# Patient Record
Sex: Female | Born: 1970
Health system: Southern US, Community
[De-identification: ages and names within clinical notes are randomized; demographics above are authoritative.]

## PROBLEM LIST (undated history)

## (undated) HISTORY — PX: TUBAL LIGATION: SHX77

## (undated) HISTORY — PX: TONSILLECTOMY: SUR1361

---

## 1998-07-09 ENCOUNTER — Other Ambulatory Visit: Admission: RE | Admit: 1998-07-09 | Discharge: 1998-07-09 | Payer: Self-pay | Admitting: Obstetrics and Gynecology

## 1999-01-22 ENCOUNTER — Inpatient Hospital Stay (HOSPITAL_COMMUNITY): Admission: AD | Admit: 1999-01-22 | Discharge: 1999-01-25 | Payer: Self-pay | Admitting: Obstetrics and Gynecology

## 2000-01-01 ENCOUNTER — Other Ambulatory Visit: Admission: RE | Admit: 2000-01-01 | Discharge: 2000-01-01 | Payer: Self-pay | Admitting: Obstetrics and Gynecology

## 2000-01-20 ENCOUNTER — Ambulatory Visit (HOSPITAL_COMMUNITY): Admission: RE | Admit: 2000-01-20 | Discharge: 2000-01-20 | Payer: Self-pay | Admitting: Obstetrics and Gynecology

## 2002-08-06 ENCOUNTER — Emergency Department (HOSPITAL_COMMUNITY): Admission: EM | Admit: 2002-08-06 | Discharge: 2002-08-06 | Payer: Self-pay | Admitting: Emergency Medicine

## 2002-08-29 ENCOUNTER — Encounter: Admission: RE | Admit: 2002-08-29 | Discharge: 2002-08-29 | Payer: Self-pay | Admitting: Family Medicine

## 2002-08-29 ENCOUNTER — Encounter: Payer: Self-pay | Admitting: Family Medicine

## 2005-10-16 ENCOUNTER — Other Ambulatory Visit: Admission: RE | Admit: 2005-10-16 | Discharge: 2005-10-16 | Payer: Self-pay | Admitting: Family Medicine

## 2006-12-16 ENCOUNTER — Other Ambulatory Visit: Admission: RE | Admit: 2006-12-16 | Discharge: 2006-12-16 | Payer: Self-pay | Admitting: Family Medicine

## 2007-03-18 ENCOUNTER — Encounter: Admission: RE | Admit: 2007-03-18 | Discharge: 2007-03-18 | Payer: Self-pay | Admitting: Otolaryngology

## 2010-10-24 ENCOUNTER — Other Ambulatory Visit: Payer: Self-pay | Admitting: Family Medicine

## 2010-10-24 ENCOUNTER — Other Ambulatory Visit (HOSPITAL_COMMUNITY)
Admission: RE | Admit: 2010-10-24 | Discharge: 2010-10-24 | Disposition: A | Discharge status: Home / Self Care | Payer: PRIVATE HEALTH INSURANCE | Source: Ambulatory Visit | Attending: Family Medicine | Admitting: Family Medicine

## 2010-10-24 DIAGNOSIS — Z124 Encounter for screening for malignant neoplasm of cervix: Secondary | ICD-10-CM | POA: Insufficient documentation

## 2010-10-24 DIAGNOSIS — Z1231 Encounter for screening mammogram for malignant neoplasm of breast: Secondary | ICD-10-CM

## 2010-11-06 ENCOUNTER — Ambulatory Visit
Admission: RE | Admit: 2010-11-06 | Discharge: 2010-11-06 | Disposition: A | Discharge status: Home / Self Care | Payer: PRIVATE HEALTH INSURANCE | Source: Ambulatory Visit | Attending: Family Medicine | Admitting: Family Medicine

## 2010-11-06 DIAGNOSIS — Z1231 Encounter for screening mammogram for malignant neoplasm of breast: Secondary | ICD-10-CM

## 2010-12-26 NOTE — Op Note (Signed)
[facility]  Patient:    Anna Snow, Anna Snow                     MRN: [id number] Proc. Date: 01/20/00 Adm. Date:  [id number] Attending:  Dierdre Forth Pearline                           Operative Report  PREOPERATIVE DIAGNOSIS:       Desire for surgical sterilization.  POSTOPERATIVE DIAGNOSIS:      Desire for surgical sterilization.  OPERATION:                    Laparoscopic tubal cautery.  SURGEON:                      Vanessa P. Haygood, M.D.  ASSISTANT:  ANESTHESIA:                   General orotracheal.  ESTIMATED BLOOD LOSS:         Less than 10 cc.  COMPLICATIONS:                None.  FINDINGS:                     The uterus, tubes, and ovaries appeared within normal limits.  DESCRIPTION OF PROCEDURE:     The patient was taken to the operating room after  appropriate identification and placed on the operating table.  After the attainment of adequate general anesthesia, she was placed in the modified lithotomy position. The abdomen, perineum, and vagina were prepped with multiple layers of Betadine and a red Robinson catheter used to empty the bladder.  A single tooth tenaculum was placed on the anterior cervix and the abdomen draped as a sterile field.  The subumbilical area was infiltrated with 8 cc of 0.25% Marcaine and a subumbilical incision made.  The Veress cannula was placed through that incision into the peritoneal cavity and a pneumoperitoneum was created with 3 liters of CO2.  The  Veress cannula was removed and the laparoscopic trocar placed through that incision into the peritoneal cavity.  The laparoscope was placed through the trocar sleeve. The above noted findings were made.  The Kleppinger cautery was then used to identify the right fallopian tube, follow it to its fimbriated end, and then grasp it at the isthmic portion and cauterize that tube in two adjacent areas.  A similar procedure was carried out on  the opposite side.  Hemostasis was noted to be adequate. All instruments were then removed from the peritoneal cavity under direct visualization as the CO2 was allowed to escape.  The subumbilical incision was closed with subcuticular sutures of 3-0 Vicryl and Steri-Strips applied.  The single tooth tenaculum was removed and the bladder again emptied with a red Robinson catheter.  The patient was then awakened from general anesthesia and taken to the recovery room in satisfactory condition having tolerated the procedure well with sponge, needle, and instrument counts were correct. DD:  01/20/00 TD:  01/21/00 Job: [id number]

## 2011-11-18 ENCOUNTER — Other Ambulatory Visit: Payer: Self-pay | Admitting: Family Medicine

## 2011-11-18 DIAGNOSIS — Z1231 Encounter for screening mammogram for malignant neoplasm of breast: Secondary | ICD-10-CM

## 2011-12-09 ENCOUNTER — Ambulatory Visit
Admission: RE | Admit: 2011-12-09 | Discharge: 2011-12-09 | Disposition: A | Discharge status: Home / Self Care | Payer: PRIVATE HEALTH INSURANCE | Source: Ambulatory Visit | Attending: Family Medicine | Admitting: Family Medicine

## 2011-12-09 DIAGNOSIS — Z1231 Encounter for screening mammogram for malignant neoplasm of breast: Secondary | ICD-10-CM

## 2013-02-20 ENCOUNTER — Other Ambulatory Visit: Payer: Self-pay

## 2013-02-20 DIAGNOSIS — Z1231 Encounter for screening mammogram for malignant neoplasm of breast: Secondary | ICD-10-CM

## 2013-03-10 ENCOUNTER — Ambulatory Visit
Admission: RE | Admit: 2013-03-10 | Discharge: 2013-03-10 | Disposition: A | Discharge status: Home / Self Care | Payer: PRIVATE HEALTH INSURANCE | Source: Ambulatory Visit

## 2013-03-10 DIAGNOSIS — Z1231 Encounter for screening mammogram for malignant neoplasm of breast: Secondary | ICD-10-CM

## 2013-12-01 ENCOUNTER — Other Ambulatory Visit: Payer: Self-pay | Admitting: Obstetrics & Gynecology

## 2013-12-01 ENCOUNTER — Other Ambulatory Visit (HOSPITAL_COMMUNITY)
Admission: RE | Admit: 2013-12-01 | Discharge: 2013-12-01 | Disposition: A | Discharge status: Home / Self Care | Payer: BC Managed Care – PPO | Source: Ambulatory Visit | Attending: Obstetrics & Gynecology | Admitting: Obstetrics & Gynecology

## 2013-12-01 DIAGNOSIS — Z01419 Encounter for gynecological examination (general) (routine) without abnormal findings: Secondary | ICD-10-CM | POA: Insufficient documentation

## 2013-12-01 DIAGNOSIS — Z1151 Encounter for screening for human papillomavirus (HPV): Secondary | ICD-10-CM | POA: Insufficient documentation

## 2013-12-19 ENCOUNTER — Encounter (HOSPITAL_COMMUNITY): Payer: Self-pay | Admitting: Pharmacist

## 2014-01-08 NOTE — H&P (Signed)
Anna RhymesBridget J Hove is an 43 y.o. female. G2P2 who presents for hysteroscopy, D&C and Novasure ablation due to heavy menstrual bleeding.The patient reports that since having her tubes tied in 2001 she has had significant increase in her menses. For the last several years she has 2-3 days of heavy bleeding using at least 6-7 super pads daily. She is constantly concerned about having accidents and at night often times she has woken up with blood in the bed. Reports passage of grape-sized clots along with significant dysmenorrhea.    Pertinent Gynecological History: Menses: as above Contraception: tubal ligation DES exposure: denies Blood transfusions: yes in 1996 Sexually transmitted diseases: no past history Previous GYN Procedures: none  Last mammogram: normal Date: August 2014 Last pap: normal Date: April 2015 OB History: G2, P2   Current Medications  None   Surgical History  Tonsillectomy 2008  BTL 2001  C-Section 1996, 2000   Family History  Father: alive, A + W  Mother: alive, A + W  Maternal Grand Mother: alive, developed bullus pemphigoid at age 45  Brother 1: deceased 5941 yrs, liver disease, diagnosed with Cirrhosis  Sister 1: alive, A + W  denies any GYN family cancer hx.   Social History  General:  Tobacco use cigarettes: Never smoked.  no Smoking.  Alcohol: yes, Rare- Holidays, eg.  no Recreational drug use.  Diet: fruits and vegetables, some smoothies.  Exercise: Walking- less lately.  Occupation: employed, Theatre managervocational program director, [employer] [place]  Marital Status: married.  Children: Boys, 2- full-time custody.  Sons 1996 and 2000.   Gyn History  Sexual activity currently sexually active.  Periods : every 25 days.  LMP 12/27/13.  Birth control BTL.  Last pap smear date 12/01/2013.  Last mammogram date 03/2013.  Denies H/O Abnormal pap smear.    OB History  Pregnancy # 1 live birth, C-section delivery, boy.  Pregnancy # 2 live birth, C-section,  boy.    Allergies  N.K.D.A.    Review of Systems  Constitutional: Negative for fever and chills.  HENT: Negative for sore throat.   Eyes: Negative for blurred vision.  Respiratory: Negative for cough, shortness of breath and wheezing.   Cardiovascular: Negative for chest pain and palpitations.  Gastrointestinal: Negative for heartburn, nausea, vomiting, abdominal pain, diarrhea and constipation.  Genitourinary: Negative for dysuria, urgency and frequency.  Skin: Negative for itching and rash.  Neurological: Negative for dizziness and headaches.   Physical Exam  Constitutional: She is oriented to person, place, and time. She appears well-developed.  HENT:  Head: Normocephalic.  Neck: No thyromegaly present.  Cardiovascular: Normal rate and regular rhythm.   Respiratory: No respiratory distress. She has no wheezes.  GI: She exhibits no distension. There is no tenderness. There is no rebound and no guarding.  Musculoskeletal: She exhibits no edema and no tenderness.  Neurological: She is alert and oriented to person, place, and time.  Skin: Skin is warm and dry.  Psychiatric: She has a normal mood and affect.   5/1: US report: Retroverted uterus, several small fibroids 2.4x2.1x2.0cm, fundal 1.7x1.3x1.2cm, posterior submucosal 1.7x1.5x1.4. Uterus inhomogenous- probable small fibroids. Right ovary with simple cyst 2.5x2.4x2.2cm, avascular. Small ~1.5cm simple follicle adjacent to left ovary, avascular, possible para ovarian or paratubal cyst. Small amount of free fluid in cul-de-sac.   Assessment/Plan: [id number] who presents for hysteroscopy, D&C, Novasure ablation due to heavy menstrual bleeding -NPO -LR @ 125cc/hr -Ancef 2g to OR -SCDs to OR -Risk, benefit, indications and alternatives reviewed  with patient in office- including risk of failure of procedure, bleeding, infection and injury including uterine perforation.  Pt is aware and inform consent to be obtained on day of  surgery.  Myna Hidalgo, DO [phone] (pager) [phone] (office)     Alessandra Bevels Edon Hoadley 01/08/2014, 7:59 PM

## 2014-01-10 ENCOUNTER — Ambulatory Visit (HOSPITAL_COMMUNITY): Payer: BC Managed Care – PPO | Admitting: Certified Registered"

## 2014-01-10 ENCOUNTER — Encounter (HOSPITAL_COMMUNITY)
Admission: RE | Disposition: A | Discharge status: Home / Self Care | Payer: Self-pay | Source: Ambulatory Visit | Attending: Obstetrics & Gynecology

## 2014-01-10 ENCOUNTER — Ambulatory Visit (HOSPITAL_COMMUNITY)
Admission: RE | Admit: 2014-01-10 | Discharge: 2014-01-10 | Disposition: A | Discharge status: Home / Self Care | Payer: BC Managed Care – PPO | Source: Ambulatory Visit | Attending: Obstetrics & Gynecology | Admitting: Obstetrics & Gynecology

## 2014-01-10 ENCOUNTER — Encounter (HOSPITAL_COMMUNITY): Payer: Self-pay | Admitting: Anesthesiology

## 2014-01-10 ENCOUNTER — Encounter (HOSPITAL_COMMUNITY): Payer: BC Managed Care – PPO | Admitting: Certified Registered"

## 2014-01-10 DIAGNOSIS — N854 Malposition of uterus: Secondary | ICD-10-CM | POA: Insufficient documentation

## 2014-01-10 DIAGNOSIS — N920 Excessive and frequent menstruation with regular cycle: Secondary | ICD-10-CM | POA: Insufficient documentation

## 2014-01-10 HISTORY — PX: HYSTEROSCOPY WITH THERMACHOICE: SHX5396

## 2014-01-10 HISTORY — PX: DILITATION & CURRETTAGE/HYSTROSCOPY WITH NOVASURE ABLATION: SHX5568

## 2014-01-10 LAB — CBC
HCT: 38 % (ref 36.0–46.0)
Hemoglobin: 12.5 g/dL (ref 12.0–15.0)
MCH: 29.3 pg (ref 26.0–34.0)
MCHC: 32.9 g/dL (ref 30.0–36.0)
MCV: 89 fL (ref 78.0–100.0)
PLATELETS: 271 10*3/uL (ref 150–400)
RBC: 4.27 MIL/uL (ref 3.87–5.11)
RDW: 13.5 % (ref 11.5–15.5)
WBC: 7.2 10*3/uL (ref 4.0–10.5)

## 2014-01-10 LAB — PREGNANCY, URINE: PREG TEST UR: NEGATIVE

## 2014-01-10 SURGERY — DILATATION & CURETTAGE/HYSTEROSCOPY WITH NOVASURE ABLATION
Anesthesia: General | Site: Uterus

## 2014-01-10 MED ORDER — GLYCOPYRROLATE 0.2 MG/ML IJ SOLN
INTRAMUSCULAR | Status: DC | PRN
  Administered 2014-01-10: .15 mg via INTRAVENOUS

## 2014-01-10 MED ORDER — FENTANYL CITRATE 0.05 MG/ML IJ SOLN
INTRAMUSCULAR | Status: AC
  Filled 2014-01-10: qty 2

## 2014-01-10 MED ORDER — MIDAZOLAM HCL 2 MG/2ML IJ SOLN
INTRAMUSCULAR | Status: DC | PRN
  Administered 2014-01-10: 2 mg via INTRAVENOUS

## 2014-01-10 MED ORDER — PROPOFOL 10 MG/ML IV BOLUS
INTRAVENOUS | Status: DC | PRN
  Administered 2014-01-10: 150 mg via INTRAVENOUS

## 2014-01-10 MED ORDER — PHENYLEPHRINE 40 MCG/ML (10ML) SYRINGE FOR IV PUSH (FOR BLOOD PRESSURE SUPPORT)
PREFILLED_SYRINGE | INTRAVENOUS | Status: AC
  Filled 2014-01-10: qty 5

## 2014-01-10 MED ORDER — IBUPROFEN 600 MG PO TABS: 600.0000 mg | ORAL_TABLET | Freq: Four times a day (QID) | ORAL | Status: AC | PRN

## 2014-01-10 MED ORDER — MIDAZOLAM HCL 2 MG/2ML IJ SOLN
INTRAMUSCULAR | Status: AC
  Filled 2014-01-10: qty 2

## 2014-01-10 MED ORDER — ONDANSETRON HCL 4 MG/2ML IJ SOLN
INTRAMUSCULAR | Status: DC | PRN
  Administered 2014-01-10: 4 mg via INTRAVENOUS

## 2014-01-10 MED ORDER — LACTATED RINGERS IV SOLN
INTRAVENOUS | Status: DC
  Administered 2014-01-10: 11:00:00 via INTRAVENOUS

## 2014-01-10 MED ORDER — SILVER NITRATE-POT NITRATE 75-25 % EX MISC
CUTANEOUS | Status: AC
  Filled 2014-01-10: qty 1

## 2014-01-10 MED ORDER — DEXAMETHASONE SODIUM PHOSPHATE 10 MG/ML IJ SOLN
INTRAMUSCULAR | Status: DC | PRN
  Administered 2014-01-10: 10 mg via INTRAVENOUS

## 2014-01-10 MED ORDER — EPHEDRINE 5 MG/ML INJ
INTRAVENOUS | Status: AC
  Filled 2014-01-10: qty 20

## 2014-01-10 MED ORDER — LACTATED RINGERS IV SOLN
INTRAVENOUS | Status: DC
  Administered 2014-01-10 (×2): via INTRAVENOUS

## 2014-01-10 MED ORDER — PHENYLEPHRINE HCL 10 MG/ML IJ SOLN
INTRAMUSCULAR | Status: DC | PRN
  Administered 2014-01-10 (×7): 40 ug via INTRAVENOUS

## 2014-01-10 MED ORDER — IBUPROFEN 600 MG PO TABS: 600.0000 mg | ORAL_TABLET | Freq: Four times a day (QID) | ORAL | Status: DC | PRN

## 2014-01-10 MED ORDER — FENTANYL CITRATE 0.05 MG/ML IJ SOLN
INTRAMUSCULAR | Status: DC | PRN
  Administered 2014-01-10 (×2): 50 ug via INTRAVENOUS

## 2014-01-10 MED ORDER — LIDOCAINE HCL (CARDIAC) 20 MG/ML IV SOLN
INTRAVENOUS | Status: DC | PRN
  Administered 2014-01-10: 50 mg via INTRAVENOUS

## 2014-01-10 MED ORDER — CEFAZOLIN SODIUM-DEXTROSE 2-3 GM-% IV SOLR
2.0000 g | INTRAVENOUS | Status: AC
  Administered 2014-01-10: 2 g via INTRAVENOUS

## 2014-01-10 MED ORDER — KETOROLAC TROMETHAMINE 30 MG/ML IJ SOLN
INTRAMUSCULAR | Status: DC | PRN
  Administered 2014-01-10: 30 mg via INTRAVENOUS

## 2014-01-10 SURGICAL SUPPLY — 27 items
ABLATOR ENDOMETRIAL BIPOLAR (ABLATOR) ×1 IMPLANT
CANISTER SUCT 3000ML (MISCELLANEOUS) ×2 IMPLANT
CATH ROBINSON RED A/P 16FR (CATHETERS) ×2 IMPLANT
CATH THERMACHOICE III (CATHETERS) ×1 IMPLANT
CLOTH BEACON ORANGE TIMEOUT ST (SAFETY) ×2 IMPLANT
CONTAINER PREFILL 10% NBF 60ML (FORM) ×4 IMPLANT
D5W 250 ×1 IMPLANT
DILATOR CANAL MILEX (MISCELLANEOUS) IMPLANT
DRAPE HYSTEROSCOPY (DRAPE) ×2 IMPLANT
DRSG TELFA 3X8 NADH (GAUZE/BANDAGES/DRESSINGS) ×2 IMPLANT
GLOVE BIOGEL PI IND STRL 6.5 (GLOVE) ×2 IMPLANT
GLOVE BIOGEL PI INDICATOR 6.5 (GLOVE) ×2
GLOVE ECLIPSE 6.5 STRL STRAW (GLOVE) ×2 IMPLANT
GLYCINE 1.5% IRRIG UROMATIC (IV SOLUTION) ×1 IMPLANT
GOWN STRL REUS W/TWL LRG LVL3 (GOWN DISPOSABLE) ×4 IMPLANT
IV LACTATED RINGER IRRG 3000ML (IV SOLUTION) ×2
IV LR IRRIG 3000ML ARTHROMATIC (IV SOLUTION) IMPLANT
NDL SPNL 22GX3.5 QUINCKE BK (NEEDLE) ×1 IMPLANT
NEEDLE SPNL 22GX3.5 QUINCKE BK (NEEDLE) ×2 IMPLANT
PACK VAGINAL MINOR WOMEN LF (CUSTOM PROCEDURE TRAY) ×2 IMPLANT
PAD DRESSING TELFA 3X8 NADH (GAUZE/BANDAGES/DRESSINGS) ×1 IMPLANT
PAD OB MATERNITY 4.3X12.25 (PERSONAL CARE ITEMS) ×2 IMPLANT
SET TUBING HYSTEROSCOPY 2 NDL (TUBING) ×1 IMPLANT
SYR CONTROL 10ML LL (SYRINGE) ×2 IMPLANT
TOWEL OR 17X24 6PK STRL BLUE (TOWEL DISPOSABLE) ×4 IMPLANT
TUBE HYSTEROSCOPY W Y-CONNECT (TUBING) ×1 IMPLANT
WATER STERILE IRR 1000ML POUR (IV SOLUTION) ×2 IMPLANT

## 2014-01-10 NOTE — Interval H&P Note (Signed)
History and Physical Interval Note:  01/10/2014 10:41 AM  Anna Snow  has presented today for surgery, with the diagnosis of Heavy Menses  The various methods of treatment have been discussed with the patient and family. After consideration of risks, benefits and other options for treatment, the patient has consented to  Procedure(s): DILATATION & CURETTAGE/HYSTEROSCOPY WITH NOVASURE ABLATION (N/A) as a surgical intervention .  The patient's history has been reviewed, patient examined, no change in status, stable for surgery.  I have reviewed the patient's chart and labs.  Questions were answered to the patient's satisfaction.     Sharon Seller

## 2014-01-10 NOTE — Anesthesia Preprocedure Evaluation (Signed)
Anesthesia Evaluation  Patient identified by MRN, date of birth, ID band Patient awake    Reviewed: Allergy & Precautions, H&P , NPO status , Patient's Chart, lab work & pertinent test results  Airway Mallampati: I  TM Distance: >3 FB Neck ROM: full    Dental no notable dental hx. (+) Teeth Intact   Pulmonary neg pulmonary ROS,    Pulmonary exam normal       Cardiovascular negative cardio ROS      Neuro/Psych negative neurological ROS  negative psych ROS   GI/Hepatic negative GI ROS, Neg liver ROS,   Endo/Other  negative endocrine ROS  Renal/GU negative Renal ROS     Musculoskeletal   Abdominal Normal abdominal exam  (+)   Peds  Hematology negative hematology ROS (+)   Anesthesia Other Findings   Reproductive/Obstetrics negative OB ROS                            Anesthesia Physical Anesthesia Plan  ASA: I  Anesthesia Plan: General   Post-op Pain Management:    Induction: Intravenous  Airway Management Planned: LMA  Additional Equipment:   Intra-op Plan:   Post-operative Plan:   Informed Consent: I have reviewed the patients History and Physical, chart, labs and discussed the procedure including the risks, benefits and alternatives for the proposed anesthesia with the patient or authorized representative who has indicated his/her understanding and acceptance.     Plan Discussed with: CRNA and Surgeon  Anesthesia Plan Comments:        Anesthesia Quick Evaluation  

## 2014-01-10 NOTE — Op Note (Signed)
Operative Report  PreOp: Heavy menstrual bleeding PostOp: same Procedure:  Hysteroscopy, Dilation and Curettage, Failed Novasure, Thermachoice ablation Surgeon: Dr. Myna Hidalgo Anesthesia: General Complications:none EBL: Minimal UOP: 100cc IVF: 1100cc   Findings:  Retroverted 8wk sized uterus with proliferative endometrium, both ostia visualized  Specimens: 1) Endocervical curettings 2) endometrial curettings  Indications: 42y G2P2 who reports heavy menstrual bleeding since having her tubes tied in 2001.  For the last several years she has 2-3 days of heavy bleeding using at least 6-7 super pads daily. She is constantly concerned about having accidents and at night often times she has woken up with blood in the bed. Reports passage of grape-sized clots along with significant dysmenorrhea.    Procedure: The patient was taken to the operating room where she underwent general anesthesia without difficulty. The patient was placed in a low lithotomy position using Allen stirrups. She was prepped and draped in the normal sterile fashion. The bladder was drained using a red rubber urethral catheter. A sterile speculum was inserted into the vagina. A single tooth tenaculum was placed on the anterior lip of the cervix. ECC was obtained.  The uterus was then sounded to 8cm, cervical length of 4cm. The endocervical canal was then serially dilated to allow for passage of the hysteroscope. The diagnostic hysteroscope was then inserted without difficulty and noted to have the findings as listed above. Visualization was achieved using glycine as a distending medium. The hysteroscope was removed and sharp curettage was performed. The tissue was sent to pathology.   Attention was then turned to the Novasure. The Novasure was set up according to manufacture instructions. The cavity length was set to 4. The Novasure was inserted, seating test performed and the cavity width was noted to be 4.3. Cavity assessment  was performed and failed despite multiple attempts and use of gauze to ensure a tight seal. The device could not be activated as cavity assessment was not passed.  Novasure was removed and the hysteroscope was reinserted to ensure there was no uterine perforation.  Ablation was performed using the Thermachoice ablation system according to package guidelines.  Upon completion, the Thermachoice was removed and the hysteroscope was reinserted. Global ablation was visualized and no uterine perforation was seen. All instrument were then removed. Hemostasis was observed at the cervical site.  The patient was repositioned to the supine position. The patient tolerated the procedure without any complications and taken to recovery in stable condition.   Myna Hidalgo, DO [phone] (pager) [phone] (office)

## 2014-01-10 NOTE — Discharge Instructions (Signed)
HOME INSTRUCTIONS  May take ibuprofen after 6:00pm as needed for cramps  Please note any unusual or excessive bleeding, pain, swelling. Mild dizziness or drowsiness are normal for about 24 hours after surgery.   Shower when comfortable  Restrictions: No driving for 24 hours or while taking pain medications.  Activity:  No heavy lifting (> 10 lbs), nothing in vagina (no tampons, douching, or intercourse) x 4 weeks; no tub baths for 4 weeks Vaginal spotting is expected but if your bleeding is heavy, period like,  please call the office   Incision: the bandaids will fall off when they are ready to; you may clean your incision with mild soap and water but do not rub or scrub the incision site.  You may experience slight bloody drainage from your incision periodically.  This is normal.  If you experience a large amount of drainage or the incision opens, please call your physician who will likely direct you to the emergency department.  Diet:  You may eat whatever you want.  Do not eat large meals.  Eat small frequent meals throughout the day.  Continue to drink a good amount of water at least 6-8 glasses of water per day, hydration is very important for the healing process.  Pain Management: Take Motrin and/or Norco as prescribed/needed for pain.  Always take prescription pain medication with food, it may cause constipation, increase fluids and fiber and you may want to take an over-the-counter stool softener like Colace as needed up to 2x a day.    Alcohol -- Avoid for 24 hours and while taking pain medications.  Nausea: Take sips of ginger ale or soda  Fever -- Call physician if temperature over 101 degrees  Follow up:  If you do not already have a follow up appointment scheduled, please call the office at [phone].  If you experience fever (a temperature greater than 100.4), pain unrelieved by pain medication, shortness of breath, swelling of a single leg, or any other symptoms which are  concerning to you please the office immediately.

## 2014-01-10 NOTE — Anesthesia Postprocedure Evaluation (Signed)
Anesthesia Post Note  Patient: Anna Snow  Procedure(s) Performed: Procedure(s) (LRB): DILATATION & CURETTAGE/HYSTEROSCOPY WITH failed NOVASURE ABLATION (N/A) HYSTEROSCOPY WITH THERMACHOICE (N/A)  Anesthesia type: General  Patient location: PACU  Post pain: Pain level controlled  Post assessment: Post-op Vital signs reviewed  Last Vitals:  Filed Vitals:   01/10/14 1230  BP: 111/61  Pulse: 74  Temp: 36.7 C  Resp: 12    Post vital signs: Reviewed  Level of consciousness: sedated  Complications: No apparent anesthesia complications

## 2014-01-10 NOTE — Transfer of Care (Signed)
Immediate Anesthesia Transfer of Care Note  Patient: Anna Snow  Procedure(s) Performed: Procedure(s): DILATATION & CURETTAGE/HYSTEROSCOPY WITH failed NOVASURE ABLATION (N/A) HYSTEROSCOPY WITH THERMACHOICE (N/A)  Patient Location: PACU  Anesthesia Type:General  Level of Consciousness: awake, alert  and oriented  Airway & Oxygen Therapy: Patient Spontanous Breathing  Post-op Assessment: Report given to PACU RN and Post -op Vital signs reviewed and stable  Post vital signs: Reviewed and stable  Complications: No apparent anesthesia complications

## 2014-01-11 ENCOUNTER — Encounter (HOSPITAL_COMMUNITY): Payer: Self-pay | Admitting: Obstetrics & Gynecology

## 2014-04-11 ENCOUNTER — Other Ambulatory Visit: Payer: Self-pay

## 2014-04-11 DIAGNOSIS — Z1231 Encounter for screening mammogram for malignant neoplasm of breast: Secondary | ICD-10-CM

## 2014-04-27 ENCOUNTER — Ambulatory Visit
Admission: RE | Admit: 2014-04-27 | Discharge: 2014-04-27 | Disposition: A | Discharge status: Home / Self Care | Payer: BC Managed Care – PPO | Source: Ambulatory Visit

## 2014-04-27 ENCOUNTER — Ambulatory Visit: Payer: PRIVATE HEALTH INSURANCE

## 2014-04-27 DIAGNOSIS — Z1231 Encounter for screening mammogram for malignant neoplasm of breast: Secondary | ICD-10-CM

## 2015-04-06 ENCOUNTER — Encounter (HOSPITAL_COMMUNITY): Payer: Self-pay | Admitting: Emergency Medicine

## 2015-04-06 ENCOUNTER — Emergency Department (INDEPENDENT_AMBULATORY_CARE_PROVIDER_SITE_OTHER)
Admission: EM | Admit: 2015-04-06 | Discharge: 2015-04-06 | Disposition: A | Discharge status: Home / Self Care | Payer: BLUE CROSS/BLUE SHIELD | Source: Home / Self Care | Attending: Family Medicine | Admitting: Family Medicine

## 2015-04-06 DIAGNOSIS — Z23 Encounter for immunization: Secondary | ICD-10-CM | POA: Diagnosis not present

## 2015-04-06 DIAGNOSIS — S0181XA Laceration without foreign body of other part of head, initial encounter: Secondary | ICD-10-CM | POA: Diagnosis not present

## 2015-04-06 MED ORDER — TETANUS-DIPHTH-ACELL PERTUSSIS 5-2.5-18.5 LF-MCG/0.5 IM SUSP
INTRAMUSCULAR | Status: AC
  Filled 2015-04-06: qty 0.5

## 2015-04-06 MED ORDER — TETANUS-DIPHTH-ACELL PERTUSSIS 5-2.5-18.5 LF-MCG/0.5 IM SUSP
0.5000 mL | Freq: Once | INTRAMUSCULAR | Status: AC
  Administered 2015-04-06: 0.5 mL via INTRAMUSCULAR

## 2015-04-06 NOTE — ED Provider Notes (Signed)
CSN: [id number]     Arrival date & time 04/06/15  1816 History   First MD Initiated Contact with Patient 04/06/15 1908     Chief Complaint  Patient presents with  . Laceration   (Consider location/radiation/quality/duration/timing/severity/associated sxs/prior Treatment) Patient is a 44 y.o. female presenting with scalp laceration. The history is provided by the patient.  Head Laceration This is a new problem. The current episode started 1 to 2 hours ago (struck forehead on edge of car door while running to get out of rain). The problem has not changed since onset.Pertinent negatives include no chest pain and no headaches. Nothing aggravates the symptoms. Nothing relieves the symptoms.    History reviewed. No pertinent past medical history. Past Surgical History  Procedure Laterality Date  . Tubal ligation    . Cesarean section  1996, 2000  . Tonsillectomy    . Dilitation & currettage/hystroscopy with novasure ablation N/A 01/10/2014    Procedure: DILATATION & CURETTAGE/HYSTEROSCOPY WITH failed NOVASURE ABLATION;  Surgeon: Sharon Seller, DO;  Location: [facility];  Service: Gynecology;  Laterality: N/A;  . Hysteroscopy with thermachoice N/A 01/10/2014    Procedure: HYSTEROSCOPY WITH THERMACHOICE;  Surgeon: Sharon Seller, DO;  Location: [facility];  Service: Gynecology;  Laterality: N/A;   No family history on file. Social History  Substance Use Topics  . Smoking status: Never Smoker   . Smokeless tobacco: None  . Alcohol Use: No   OB History    No data available     Review of Systems  Constitutional: Negative.   HENT: Negative.   Cardiovascular: Negative for chest pain.  Neurological: Negative.  Negative for headaches.    Allergies  Review of patient's allergies indicates no known allergies.  Home Medications   Prior to Admission medications   Medication Sig Start Date End Date Taking? Authorizing Provider  ibuprofen (ADVIL,MOTRIN) 600 MG tablet Take 1 tablet (600 mg total)  by mouth every 6 (six) hours as needed. 01/10/14   Myna Hidalgo, DO   Meds Ordered and Administered this Visit   Medications  Tdap (BOOSTRIX) injection 0.5 mL (not administered)    BP 154/99 mmHg  Pulse 70  Temp(Src) 97.8 F (36.6 C) (Oral)  Resp 18  SpO2 99% No data found.   Physical Exam  Constitutional: She is oriented to person, place, and time. She appears well-developed and well-nourished. No distress.  HENT:  Head: Normocephalic.  2cm forehead lac.  Eyes: Conjunctivae and EOM are normal. Pupils are equal, round, and reactive to light.  Neck: Normal range of motion. Neck supple.  Neurological: She is alert and oriented to person, place, and time. No cranial nerve deficit.  Skin: Skin is warm and dry.  Nursing note and vitals reviewed.   ED Course  LACERATION REPAIR Date/Time: 04/06/2015 7:27 PM Performed by: Linna Hoff Authorized by: Bradd Canary D Consent: Verbal consent obtained. Consent given by: patient Body area: head/neck Location details: forehead Laceration length: 2.5 cm Foreign bodies: no foreign bodies Tendon involvement: none Nerve involvement: none Vascular damage: no Patient sedated: no Preparation: Patient was prepped and draped in the usual sterile fashion. Irrigation solution: tap water Amount of cleaning: standard Debridement: none Degree of undermining: none Skin closure: glue Technique: simple Approximation: close Approximation difficulty: simple Patient tolerance: Patient tolerated the procedure well with no immediate complications   (including critical care time)  Labs Review Labs Reviewed - No data to display  Imaging Review No results found.   Visual Acuity Review  Right Eye Distance:   Left Eye Distance:   Bilateral Distance:    Right Eye Near:   Left Eye Near:    Bilateral Near:         MDM   1. Laceration of forehead without complication, initial encounter        Linna Hoff, MD 04/06/15 [phone]

## 2015-04-06 NOTE — ED Notes (Signed)
Running to car in the rain, hit head on car door, laceration to left forehead

## 2015-05-01 ENCOUNTER — Emergency Department (INDEPENDENT_AMBULATORY_CARE_PROVIDER_SITE_OTHER)
Admission: EM | Admit: 2015-05-01 | Discharge: 2015-05-01 | Disposition: A | Discharge status: Home / Self Care | Payer: BLUE CROSS/BLUE SHIELD | Source: Home / Self Care | Attending: Emergency Medicine | Admitting: Emergency Medicine

## 2015-05-01 ENCOUNTER — Encounter (HOSPITAL_COMMUNITY): Payer: Self-pay | Admitting: Emergency Medicine

## 2015-05-01 DIAGNOSIS — Z5189 Encounter for other specified aftercare: Secondary | ICD-10-CM | POA: Diagnosis not present

## 2015-05-01 NOTE — Discharge Instructions (Signed)
There is some scar tissue in a little bit of swelling. Apply ice to help with the swelling. You can do some massage to help break up the scar tissue. Use sunscreen on the scar to help it fade. The cut has healed well.

## 2015-05-01 NOTE — ED Notes (Signed)
Pt is here to have a wound on her left forehead rechecked.  The wound has healed, but she has a small knot that has developed just above the scar.  It is tender to touch.

## 2015-05-01 NOTE — ED Provider Notes (Signed)
CSN: [id number]     Arrival date & time 05/01/15  1335 History   First MD Initiated Contact with Patient 05/01/15 1640     Chief Complaint  Patient presents with  . Wound Check   (Consider location/radiation/quality/duration/timing/severity/associated sxs/prior Treatment) HPI  She is a 44 year old woman here for a wound check. She was here about one month ago for a laceration to her left forehead. It was repaired with glue at that time. She states the glue fell off about a week and a half ago. She states it has healed well, but she has had a bump at the laceration and some swelling. She states it is a little sore. No redness or drainage.  History reviewed. No pertinent past medical history. Past Surgical History  Procedure Laterality Date  . Tubal ligation    . Cesarean section  1996, 2000  . Tonsillectomy    . Dilitation & currettage/hystroscopy with novasure ablation N/A 01/10/2014    Procedure: DILATATION & CURETTAGE/HYSTEROSCOPY WITH failed NOVASURE ABLATION;  Surgeon: Sharon Seller, DO;  Location: [facility];  Service: Gynecology;  Laterality: N/A;  . Hysteroscopy with thermachoice N/A 01/10/2014    Procedure: HYSTEROSCOPY WITH THERMACHOICE;  Surgeon: Sharon Seller, DO;  Location: [facility];  Service: Gynecology;  Laterality: N/A;   History reviewed. No pertinent family history. Social History  Substance Use Topics  . Smoking status: Never Smoker   . Smokeless tobacco: None  . Alcohol Use: No   OB History    No data available     Review of Systems As in history of present illness Allergies  Review of patient's allergies indicates no known allergies.  Home Medications   Prior to Admission medications   Medication Sig Start Date End Date Taking? Authorizing Francisca Langenderfer  ibuprofen (ADVIL,MOTRIN) 600 MG tablet Take 1 tablet (600 mg total) by mouth every 6 (six) hours as needed. 01/10/14   Myna Hidalgo, DO   Meds Ordered and Administered this Visit  Medications - No data to  display  BP 124/80 mmHg  Pulse 86  Temp(Src) 97.9 F (36.6 C) (Oral)  Resp 18  SpO2 99%  LMP 04/29/2015 (Exact Date) No data found.   Physical Exam  Constitutional: She is oriented to person, place, and time. She appears well-developed and well-nourished. No distress.  Cardiovascular: Normal rate.   Pulmonary/Chest: Effort normal.  Neurological: She is alert and oriented to person, place, and time.  Skin:  Well-healed 1 cm laceration to left forehead. There is some underlying scar tissue and a small amount of swelling.    ED Course  Procedures (including critical care time)  Labs Review Labs Reviewed - No data to display  Imaging Review No results found.   MDM   1. Visit for wound check    Laceration has healed well. Reassurance provided. Recommended massage and ice to help with the scar tissue and swelling.     Charm Rings, MD 05/01/15 1710

## 2015-12-02 ENCOUNTER — Other Ambulatory Visit: Payer: Self-pay

## 2015-12-02 DIAGNOSIS — Z1231 Encounter for screening mammogram for malignant neoplasm of breast: Secondary | ICD-10-CM

## 2015-12-19 ENCOUNTER — Ambulatory Visit
Admission: RE | Admit: 2015-12-19 | Discharge: 2015-12-19 | Disposition: A | Discharge status: Home / Self Care | Payer: BLUE CROSS/BLUE SHIELD | Source: Ambulatory Visit

## 2015-12-19 DIAGNOSIS — Z1231 Encounter for screening mammogram for malignant neoplasm of breast: Secondary | ICD-10-CM | POA: Diagnosis not present

## 2015-12-25 DIAGNOSIS — H5213 Myopia, bilateral: Secondary | ICD-10-CM | POA: Diagnosis not present

## 2016-01-13 DIAGNOSIS — Z01411 Encounter for gynecological examination (general) (routine) with abnormal findings: Secondary | ICD-10-CM | POA: Diagnosis not present

## 2016-01-13 DIAGNOSIS — N939 Abnormal uterine and vaginal bleeding, unspecified: Secondary | ICD-10-CM | POA: Diagnosis not present

## 2016-01-16 DIAGNOSIS — Z0000 Encounter for general adult medical examination without abnormal findings: Secondary | ICD-10-CM | POA: Diagnosis not present

## 2017-01-08 ENCOUNTER — Other Ambulatory Visit: Payer: Self-pay | Admitting: Obstetrics and Gynecology

## 2017-01-08 ENCOUNTER — Other Ambulatory Visit: Payer: Self-pay | Admitting: Obstetrics & Gynecology

## 2017-01-08 DIAGNOSIS — Z1231 Encounter for screening mammogram for malignant neoplasm of breast: Secondary | ICD-10-CM

## 2017-01-15 DIAGNOSIS — Z01419 Encounter for gynecological examination (general) (routine) without abnormal findings: Secondary | ICD-10-CM | POA: Diagnosis not present

## 2017-01-29 ENCOUNTER — Ambulatory Visit
Admission: RE | Admit: 2017-01-29 | Discharge: 2017-01-29 | Disposition: A | Discharge status: Home / Self Care | Payer: BLUE CROSS/BLUE SHIELD | Source: Ambulatory Visit | Attending: Obstetrics & Gynecology | Admitting: Obstetrics & Gynecology

## 2017-01-29 DIAGNOSIS — Z1231 Encounter for screening mammogram for malignant neoplasm of breast: Secondary | ICD-10-CM | POA: Diagnosis not present

## 2017-02-03 DIAGNOSIS — Z13220 Encounter for screening for lipoid disorders: Secondary | ICD-10-CM | POA: Diagnosis not present

## 2017-02-03 DIAGNOSIS — Z0000 Encounter for general adult medical examination without abnormal findings: Secondary | ICD-10-CM | POA: Diagnosis not present

## 2017-02-16 DIAGNOSIS — H5213 Myopia, bilateral: Secondary | ICD-10-CM | POA: Diagnosis not present

## 2017-10-23 DIAGNOSIS — B373 Candidiasis of vulva and vagina: Secondary | ICD-10-CM | POA: Diagnosis not present

## 2017-10-26 DIAGNOSIS — H10523 Angular blepharoconjunctivitis, bilateral: Secondary | ICD-10-CM | POA: Diagnosis not present

## 2017-11-09 DIAGNOSIS — Z111 Encounter for screening for respiratory tuberculosis: Secondary | ICD-10-CM | POA: Diagnosis not present

## 2018-02-16 ENCOUNTER — Other Ambulatory Visit: Payer: Self-pay | Admitting: Obstetrics & Gynecology

## 2018-02-16 DIAGNOSIS — H5213 Myopia, bilateral: Secondary | ICD-10-CM | POA: Diagnosis not present

## 2018-02-16 DIAGNOSIS — Z1231 Encounter for screening mammogram for malignant neoplasm of breast: Secondary | ICD-10-CM

## 2018-02-16 DIAGNOSIS — Z01411 Encounter for gynecological examination (general) (routine) with abnormal findings: Secondary | ICD-10-CM | POA: Diagnosis not present

## 2018-02-25 DIAGNOSIS — Z0000 Encounter for general adult medical examination without abnormal findings: Secondary | ICD-10-CM | POA: Diagnosis not present

## 2018-02-25 DIAGNOSIS — Z13220 Encounter for screening for lipoid disorders: Secondary | ICD-10-CM | POA: Diagnosis not present

## 2018-03-11 ENCOUNTER — Ambulatory Visit
Admission: RE | Admit: 2018-03-11 | Discharge: 2018-03-11 | Disposition: A | Discharge status: Home / Self Care | Payer: BLUE CROSS/BLUE SHIELD | Source: Ambulatory Visit | Attending: Obstetrics & Gynecology | Admitting: Obstetrics & Gynecology

## 2018-03-11 DIAGNOSIS — Z1231 Encounter for screening mammogram for malignant neoplasm of breast: Secondary | ICD-10-CM

## 2018-04-12 DIAGNOSIS — S20219A Contusion of unspecified front wall of thorax, initial encounter: Secondary | ICD-10-CM | POA: Diagnosis not present

## 2018-04-19 ENCOUNTER — Ambulatory Visit
Admission: RE | Admit: 2018-04-19 | Discharge: 2018-04-19 | Disposition: A | Discharge status: Home / Self Care | Payer: BLUE CROSS/BLUE SHIELD | Source: Ambulatory Visit | Attending: Family Medicine | Admitting: Family Medicine

## 2018-04-19 ENCOUNTER — Other Ambulatory Visit: Payer: Self-pay | Admitting: Family Medicine

## 2018-04-19 DIAGNOSIS — M549 Dorsalgia, unspecified: Secondary | ICD-10-CM | POA: Diagnosis not present

## 2018-04-19 DIAGNOSIS — T148XXA Other injury of unspecified body region, initial encounter: Secondary | ICD-10-CM | POA: Diagnosis not present

## 2018-04-19 DIAGNOSIS — S134XXA Sprain of ligaments of cervical spine, initial encounter: Secondary | ICD-10-CM | POA: Diagnosis not present

## 2018-04-19 DIAGNOSIS — M5489 Other dorsalgia: Secondary | ICD-10-CM | POA: Diagnosis not present

## 2018-04-19 DIAGNOSIS — M545 Low back pain: Secondary | ICD-10-CM | POA: Diagnosis not present

## 2018-04-19 DIAGNOSIS — S3992XA Unspecified injury of lower back, initial encounter: Secondary | ICD-10-CM | POA: Diagnosis not present

## 2018-04-29 DIAGNOSIS — M6281 Muscle weakness (generalized): Secondary | ICD-10-CM | POA: Diagnosis not present

## 2018-04-29 DIAGNOSIS — M546 Pain in thoracic spine: Secondary | ICD-10-CM | POA: Diagnosis not present

## 2018-04-29 DIAGNOSIS — M545 Low back pain: Secondary | ICD-10-CM | POA: Diagnosis not present

## 2018-05-03 DIAGNOSIS — M6281 Muscle weakness (generalized): Secondary | ICD-10-CM | POA: Diagnosis not present

## 2018-05-03 DIAGNOSIS — M545 Low back pain: Secondary | ICD-10-CM | POA: Diagnosis not present

## 2018-05-03 DIAGNOSIS — M546 Pain in thoracic spine: Secondary | ICD-10-CM | POA: Diagnosis not present

## 2018-05-05 DIAGNOSIS — M546 Pain in thoracic spine: Secondary | ICD-10-CM | POA: Diagnosis not present

## 2018-05-05 DIAGNOSIS — M6281 Muscle weakness (generalized): Secondary | ICD-10-CM | POA: Diagnosis not present

## 2018-05-05 DIAGNOSIS — M545 Low back pain: Secondary | ICD-10-CM | POA: Diagnosis not present

## 2018-05-07 DIAGNOSIS — M6281 Muscle weakness (generalized): Secondary | ICD-10-CM | POA: Diagnosis not present

## 2018-05-07 DIAGNOSIS — M546 Pain in thoracic spine: Secondary | ICD-10-CM | POA: Diagnosis not present

## 2018-05-07 DIAGNOSIS — M545 Low back pain: Secondary | ICD-10-CM | POA: Diagnosis not present

## 2018-05-09 DIAGNOSIS — M6281 Muscle weakness (generalized): Secondary | ICD-10-CM | POA: Diagnosis not present

## 2018-05-09 DIAGNOSIS — M545 Low back pain: Secondary | ICD-10-CM | POA: Diagnosis not present

## 2018-05-09 DIAGNOSIS — M546 Pain in thoracic spine: Secondary | ICD-10-CM | POA: Diagnosis not present

## 2018-05-11 DIAGNOSIS — M545 Low back pain: Secondary | ICD-10-CM | POA: Diagnosis not present

## 2018-05-11 DIAGNOSIS — M6281 Muscle weakness (generalized): Secondary | ICD-10-CM | POA: Diagnosis not present

## 2018-05-11 DIAGNOSIS — M546 Pain in thoracic spine: Secondary | ICD-10-CM | POA: Diagnosis not present

## 2018-05-13 DIAGNOSIS — M545 Low back pain: Secondary | ICD-10-CM | POA: Diagnosis not present

## 2018-05-13 DIAGNOSIS — M546 Pain in thoracic spine: Secondary | ICD-10-CM | POA: Diagnosis not present

## 2018-05-13 DIAGNOSIS — M6281 Muscle weakness (generalized): Secondary | ICD-10-CM | POA: Diagnosis not present

## 2018-05-16 DIAGNOSIS — M545 Low back pain: Secondary | ICD-10-CM | POA: Diagnosis not present

## 2018-05-16 DIAGNOSIS — M546 Pain in thoracic spine: Secondary | ICD-10-CM | POA: Diagnosis not present

## 2018-05-16 DIAGNOSIS — M6281 Muscle weakness (generalized): Secondary | ICD-10-CM | POA: Diagnosis not present

## 2018-05-18 DIAGNOSIS — M545 Low back pain: Secondary | ICD-10-CM | POA: Diagnosis not present

## 2018-05-18 DIAGNOSIS — M6281 Muscle weakness (generalized): Secondary | ICD-10-CM | POA: Diagnosis not present

## 2018-05-18 DIAGNOSIS — M546 Pain in thoracic spine: Secondary | ICD-10-CM | POA: Diagnosis not present

## 2018-05-20 DIAGNOSIS — Z23 Encounter for immunization: Secondary | ICD-10-CM | POA: Diagnosis not present

## 2018-05-20 DIAGNOSIS — M545 Low back pain: Secondary | ICD-10-CM | POA: Diagnosis not present

## 2018-05-20 DIAGNOSIS — M549 Dorsalgia, unspecified: Secondary | ICD-10-CM | POA: Diagnosis not present

## 2018-05-20 DIAGNOSIS — M6281 Muscle weakness (generalized): Secondary | ICD-10-CM | POA: Diagnosis not present

## 2018-05-20 DIAGNOSIS — M546 Pain in thoracic spine: Secondary | ICD-10-CM | POA: Diagnosis not present

## 2018-05-23 DIAGNOSIS — M546 Pain in thoracic spine: Secondary | ICD-10-CM | POA: Diagnosis not present

## 2018-05-23 DIAGNOSIS — M545 Low back pain: Secondary | ICD-10-CM | POA: Diagnosis not present

## 2018-05-23 DIAGNOSIS — M6281 Muscle weakness (generalized): Secondary | ICD-10-CM | POA: Diagnosis not present

## 2018-05-24 DIAGNOSIS — M545 Low back pain: Secondary | ICD-10-CM | POA: Diagnosis not present

## 2018-05-25 DIAGNOSIS — M546 Pain in thoracic spine: Secondary | ICD-10-CM | POA: Diagnosis not present

## 2018-05-25 DIAGNOSIS — M545 Low back pain: Secondary | ICD-10-CM | POA: Diagnosis not present

## 2018-05-25 DIAGNOSIS — M6281 Muscle weakness (generalized): Secondary | ICD-10-CM | POA: Diagnosis not present

## 2018-05-27 DIAGNOSIS — M6281 Muscle weakness (generalized): Secondary | ICD-10-CM | POA: Diagnosis not present

## 2018-05-27 DIAGNOSIS — M546 Pain in thoracic spine: Secondary | ICD-10-CM | POA: Diagnosis not present

## 2018-05-27 DIAGNOSIS — M545 Low back pain: Secondary | ICD-10-CM | POA: Diagnosis not present

## 2018-05-31 DIAGNOSIS — M545 Low back pain: Secondary | ICD-10-CM | POA: Diagnosis not present

## 2018-05-31 DIAGNOSIS — M546 Pain in thoracic spine: Secondary | ICD-10-CM | POA: Diagnosis not present

## 2018-05-31 DIAGNOSIS — M6281 Muscle weakness (generalized): Secondary | ICD-10-CM | POA: Diagnosis not present

## 2018-06-02 DIAGNOSIS — M6281 Muscle weakness (generalized): Secondary | ICD-10-CM | POA: Diagnosis not present

## 2018-06-02 DIAGNOSIS — M546 Pain in thoracic spine: Secondary | ICD-10-CM | POA: Diagnosis not present

## 2018-06-02 DIAGNOSIS — M545 Low back pain: Secondary | ICD-10-CM | POA: Diagnosis not present

## 2018-06-06 DIAGNOSIS — M6281 Muscle weakness (generalized): Secondary | ICD-10-CM | POA: Diagnosis not present

## 2018-06-06 DIAGNOSIS — M546 Pain in thoracic spine: Secondary | ICD-10-CM | POA: Diagnosis not present

## 2018-06-06 DIAGNOSIS — M545 Low back pain: Secondary | ICD-10-CM | POA: Diagnosis not present

## 2018-06-15 DIAGNOSIS — M545 Low back pain: Secondary | ICD-10-CM | POA: Diagnosis not present

## 2018-06-15 DIAGNOSIS — M6281 Muscle weakness (generalized): Secondary | ICD-10-CM | POA: Diagnosis not present

## 2018-06-15 DIAGNOSIS — M546 Pain in thoracic spine: Secondary | ICD-10-CM | POA: Diagnosis not present

## 2018-06-17 DIAGNOSIS — M545 Low back pain: Secondary | ICD-10-CM | POA: Diagnosis not present

## 2018-06-17 DIAGNOSIS — M6281 Muscle weakness (generalized): Secondary | ICD-10-CM | POA: Diagnosis not present

## 2018-06-17 DIAGNOSIS — M546 Pain in thoracic spine: Secondary | ICD-10-CM | POA: Diagnosis not present

## 2018-06-21 DIAGNOSIS — M545 Low back pain: Secondary | ICD-10-CM | POA: Diagnosis not present

## 2018-06-21 DIAGNOSIS — M4722 Other spondylosis with radiculopathy, cervical region: Secondary | ICD-10-CM | POA: Diagnosis not present

## 2018-06-22 DIAGNOSIS — M546 Pain in thoracic spine: Secondary | ICD-10-CM | POA: Diagnosis not present

## 2018-06-22 DIAGNOSIS — M6281 Muscle weakness (generalized): Secondary | ICD-10-CM | POA: Diagnosis not present

## 2018-06-22 DIAGNOSIS — M545 Low back pain: Secondary | ICD-10-CM | POA: Diagnosis not present

## 2018-06-24 DIAGNOSIS — M546 Pain in thoracic spine: Secondary | ICD-10-CM | POA: Diagnosis not present

## 2018-06-24 DIAGNOSIS — M545 Low back pain: Secondary | ICD-10-CM | POA: Diagnosis not present

## 2018-06-24 DIAGNOSIS — M6281 Muscle weakness (generalized): Secondary | ICD-10-CM | POA: Diagnosis not present

## 2018-06-28 DIAGNOSIS — M546 Pain in thoracic spine: Secondary | ICD-10-CM | POA: Diagnosis not present

## 2018-06-28 DIAGNOSIS — M6281 Muscle weakness (generalized): Secondary | ICD-10-CM | POA: Diagnosis not present

## 2018-06-28 DIAGNOSIS — M545 Low back pain: Secondary | ICD-10-CM | POA: Diagnosis not present

## 2018-07-01 DIAGNOSIS — M546 Pain in thoracic spine: Secondary | ICD-10-CM | POA: Diagnosis not present

## 2018-07-01 DIAGNOSIS — M6281 Muscle weakness (generalized): Secondary | ICD-10-CM | POA: Diagnosis not present

## 2018-07-01 DIAGNOSIS — M545 Low back pain: Secondary | ICD-10-CM | POA: Diagnosis not present

## 2018-07-03 DIAGNOSIS — M546 Pain in thoracic spine: Secondary | ICD-10-CM | POA: Diagnosis not present

## 2018-07-03 DIAGNOSIS — M545 Low back pain: Secondary | ICD-10-CM | POA: Diagnosis not present

## 2018-07-03 DIAGNOSIS — M6281 Muscle weakness (generalized): Secondary | ICD-10-CM | POA: Diagnosis not present

## 2018-07-06 DIAGNOSIS — M6281 Muscle weakness (generalized): Secondary | ICD-10-CM | POA: Diagnosis not present

## 2018-07-06 DIAGNOSIS — M545 Low back pain: Secondary | ICD-10-CM | POA: Diagnosis not present

## 2018-07-06 DIAGNOSIS — M546 Pain in thoracic spine: Secondary | ICD-10-CM | POA: Diagnosis not present

## 2018-07-11 DIAGNOSIS — M545 Low back pain: Secondary | ICD-10-CM | POA: Diagnosis not present

## 2018-07-11 DIAGNOSIS — M6281 Muscle weakness (generalized): Secondary | ICD-10-CM | POA: Diagnosis not present

## 2018-07-11 DIAGNOSIS — M546 Pain in thoracic spine: Secondary | ICD-10-CM | POA: Diagnosis not present

## 2018-07-12 DIAGNOSIS — M546 Pain in thoracic spine: Secondary | ICD-10-CM | POA: Diagnosis not present

## 2018-07-12 DIAGNOSIS — M545 Low back pain: Secondary | ICD-10-CM | POA: Diagnosis not present

## 2018-07-12 DIAGNOSIS — M6281 Muscle weakness (generalized): Secondary | ICD-10-CM | POA: Diagnosis not present

## 2018-08-16 DIAGNOSIS — M545 Low back pain: Secondary | ICD-10-CM | POA: Diagnosis not present

## 2018-09-15 DIAGNOSIS — N760 Acute vaginitis: Secondary | ICD-10-CM | POA: Diagnosis not present

## 2018-10-03 DIAGNOSIS — Z8619 Personal history of other infectious and parasitic diseases: Secondary | ICD-10-CM | POA: Diagnosis not present

## 2019-02-22 DIAGNOSIS — Z01419 Encounter for gynecological examination (general) (routine) without abnormal findings: Secondary | ICD-10-CM | POA: Diagnosis not present

## 2019-03-10 DIAGNOSIS — Z0000 Encounter for general adult medical examination without abnormal findings: Secondary | ICD-10-CM | POA: Diagnosis not present

## 2019-03-10 DIAGNOSIS — M546 Pain in thoracic spine: Secondary | ICD-10-CM | POA: Diagnosis not present

## 2019-03-10 DIAGNOSIS — G4700 Insomnia, unspecified: Secondary | ICD-10-CM | POA: Diagnosis not present

## 2019-03-10 DIAGNOSIS — N644 Mastodynia: Secondary | ICD-10-CM | POA: Diagnosis not present

## 2019-03-21 ENCOUNTER — Other Ambulatory Visit: Payer: Self-pay | Admitting: Family Medicine

## 2019-03-21 DIAGNOSIS — Z1231 Encounter for screening mammogram for malignant neoplasm of breast: Secondary | ICD-10-CM

## 2019-04-10 ENCOUNTER — Telehealth: Payer: Self-pay

## 2019-04-10 NOTE — Telephone Encounter (Signed)

## 2019-04-11 ENCOUNTER — Institutional Professional Consult (permissible substitution): Payer: BC Managed Care – PPO | Admitting: Plastic Surgery

## 2019-04-11 ENCOUNTER — Other Ambulatory Visit: Payer: Self-pay

## 2019-04-11 ENCOUNTER — Ambulatory Visit: Payer: BC Managed Care – PPO | Admitting: Plastic Surgery

## 2019-04-11 ENCOUNTER — Institutional Professional Consult (permissible substitution): Payer: BLUE CROSS/BLUE SHIELD | Admitting: Plastic Surgery

## 2019-04-11 ENCOUNTER — Encounter: Payer: Self-pay | Admitting: Plastic Surgery

## 2019-04-11 ENCOUNTER — Encounter

## 2019-04-11 DIAGNOSIS — M542 Cervicalgia: Secondary | ICD-10-CM | POA: Diagnosis not present

## 2019-04-11 DIAGNOSIS — N62 Hypertrophy of breast: Secondary | ICD-10-CM

## 2019-04-11 DIAGNOSIS — G8929 Other chronic pain: Secondary | ICD-10-CM

## 2019-04-11 DIAGNOSIS — M546 Pain in thoracic spine: Secondary | ICD-10-CM | POA: Diagnosis not present

## 2019-04-11 DIAGNOSIS — M549 Dorsalgia, unspecified: Secondary | ICD-10-CM | POA: Insufficient documentation
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
## 2019-04-21 NOTE — Progress Notes (Signed)
Can we increase tissue to be removed to 430g versus 350g?

## 2019-05-02 ENCOUNTER — Other Ambulatory Visit: Payer: Self-pay

## 2019-05-02 ENCOUNTER — Ambulatory Visit
Admission: RE | Admit: 2019-05-02 | Discharge: 2019-05-02 | Disposition: A | Discharge status: Home / Self Care | Payer: BLUE CROSS/BLUE SHIELD | Source: Ambulatory Visit | Attending: Family Medicine | Admitting: Family Medicine

## 2019-05-02 DIAGNOSIS — Z1231 Encounter for screening mammogram for malignant neoplasm of breast: Secondary | ICD-10-CM

## 2019-05-29 DIAGNOSIS — H5213 Myopia, bilateral: Secondary | ICD-10-CM | POA: Diagnosis not present

## 2019-07-26 DIAGNOSIS — Z1211 Encounter for screening for malignant neoplasm of colon: Secondary | ICD-10-CM | POA: Diagnosis not present

## 2019-09-01 DIAGNOSIS — Z1159 Encounter for screening for other viral diseases: Secondary | ICD-10-CM | POA: Diagnosis not present

## 2019-09-06 DIAGNOSIS — K635 Polyp of colon: Secondary | ICD-10-CM | POA: Diagnosis not present

## 2019-09-06 DIAGNOSIS — Z1211 Encounter for screening for malignant neoplasm of colon: Secondary | ICD-10-CM | POA: Diagnosis not present

## 2019-09-08 IMAGING — CR DG THORACIC SPINE 3V
3 series · 3 of 3 positions shown · non-contrast
Comparison: None.

CLINICAL DATA: Left mid back pain

EXAM:
THORACIC SPINE - 3 VIEWS

[t t-spine a.p.]
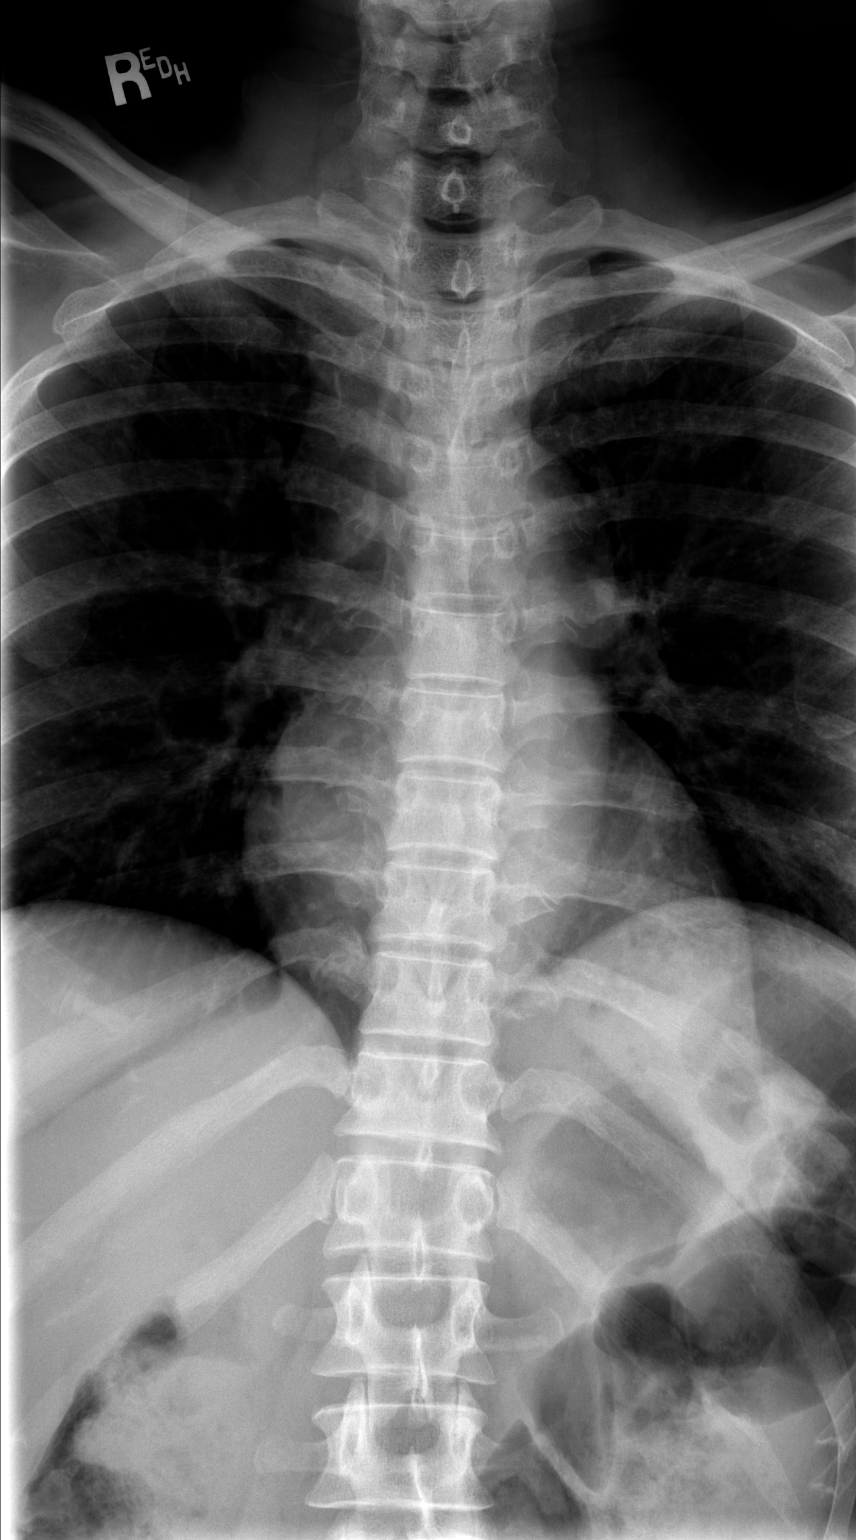

[t t-spine lat]
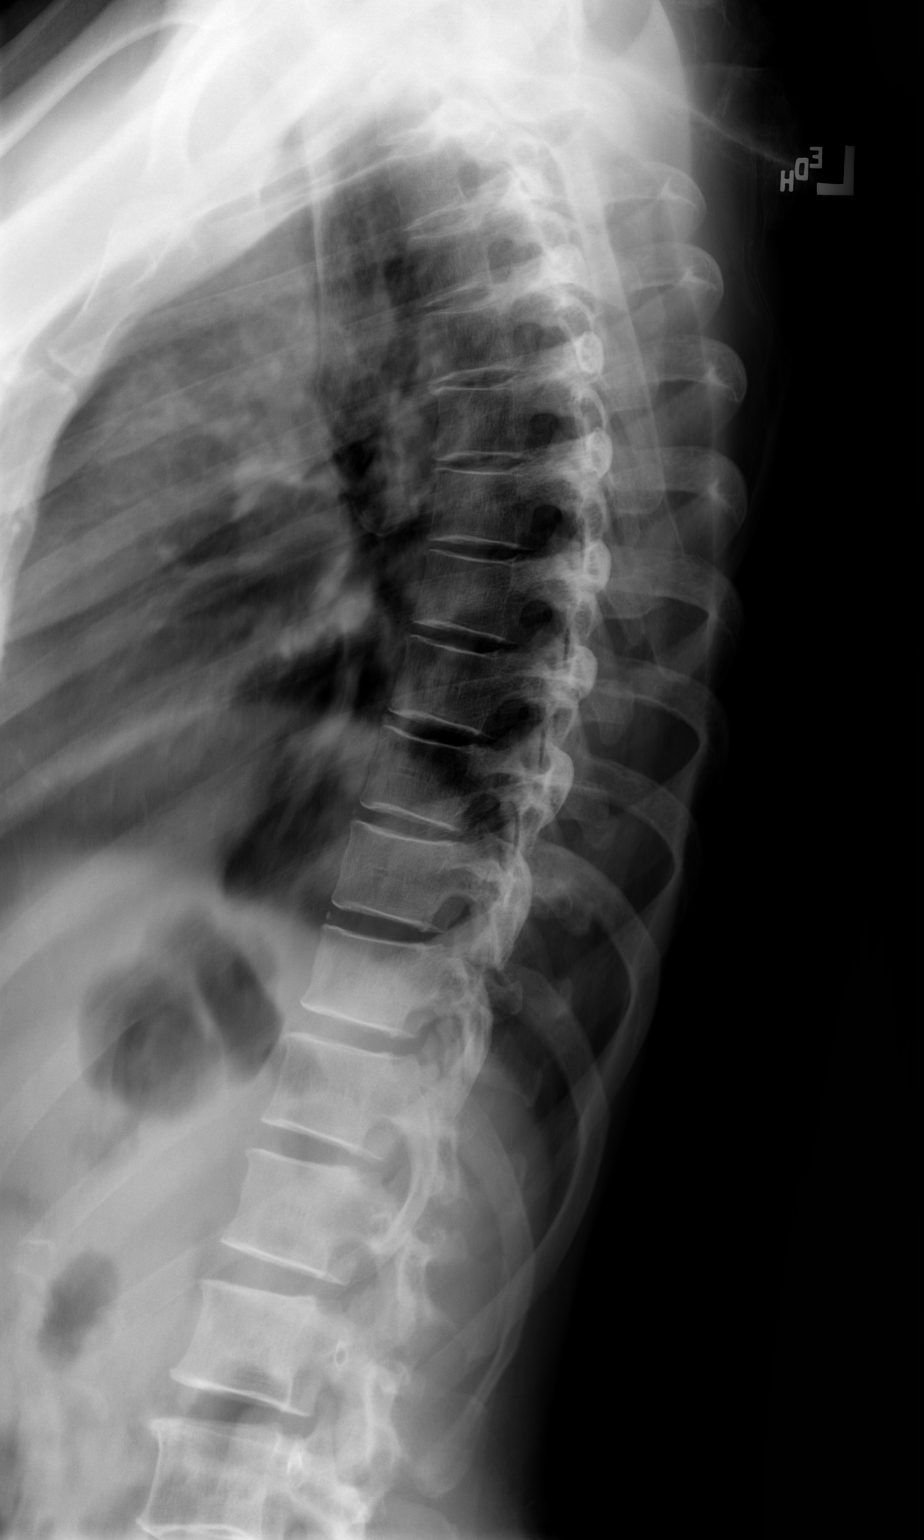

[t swimmers]
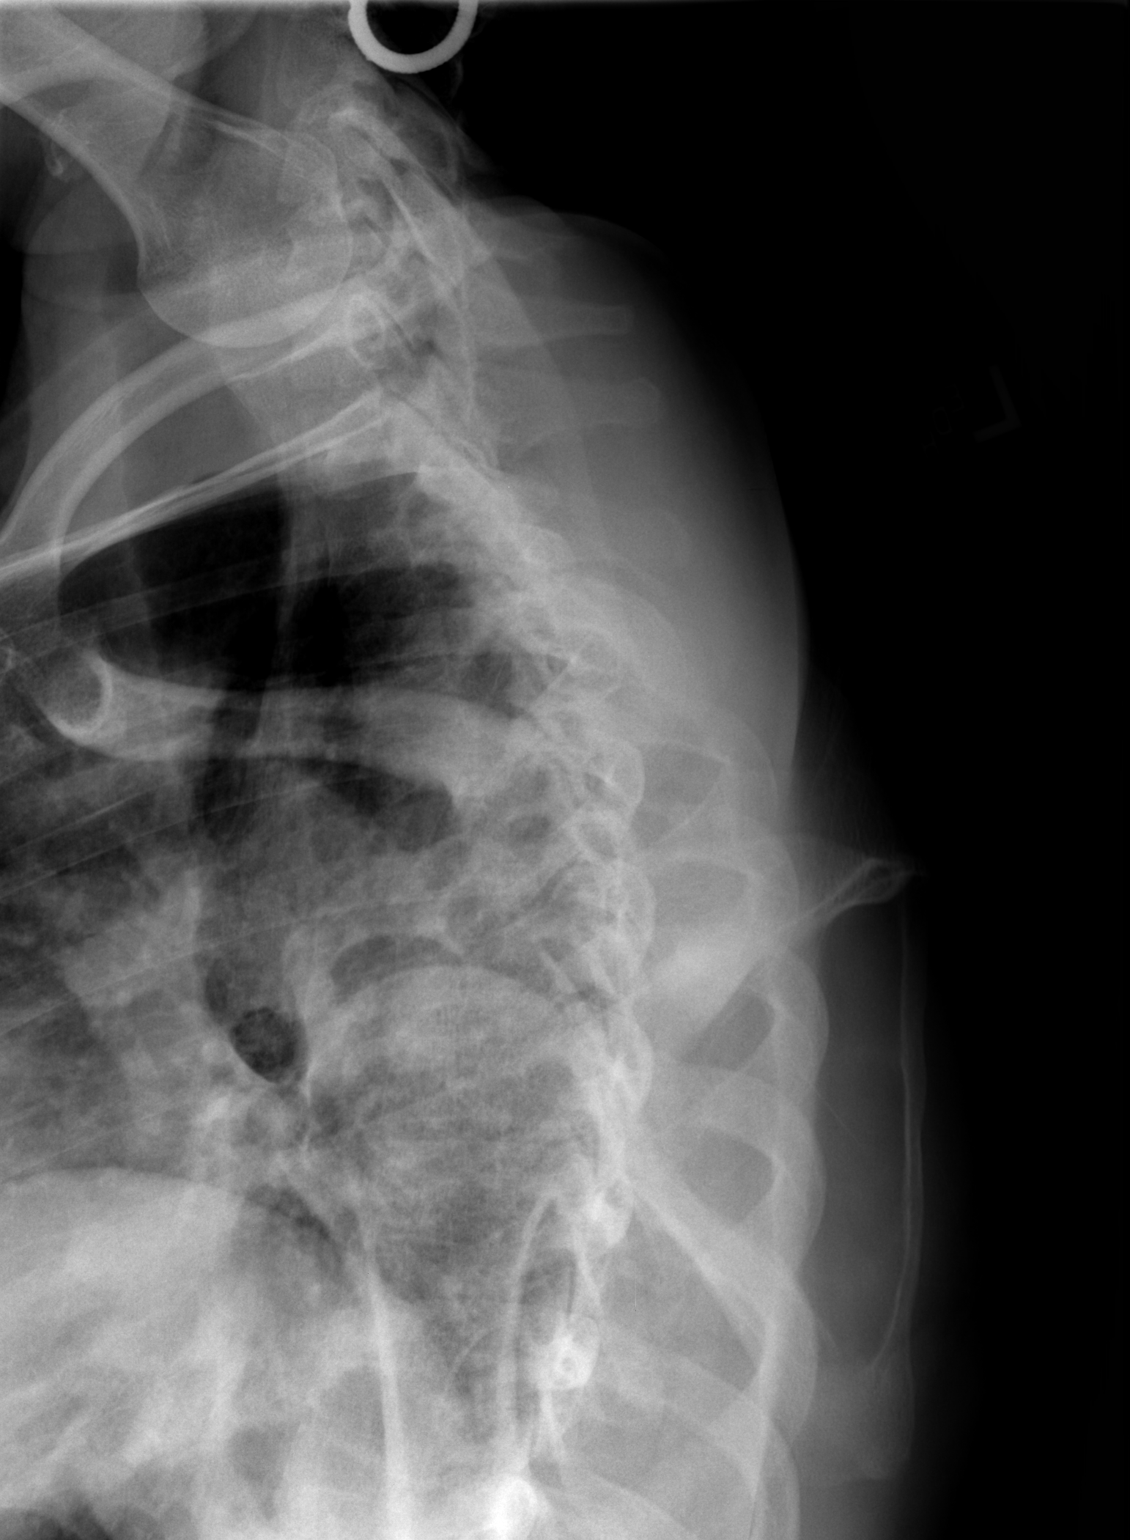

[3 of 3 positions shown; findings below may reference images not displayed]

FINDINGS: There is no evidence of thoracic spine fracture. Alignment is
normal. No other significant bone abnormalities are identified.
IMPRESSION: Negative.

## 2020-03-14 DIAGNOSIS — E559 Vitamin D deficiency, unspecified: Secondary | ICD-10-CM | POA: Diagnosis not present

## 2020-03-14 DIAGNOSIS — Z01419 Encounter for gynecological examination (general) (routine) without abnormal findings: Secondary | ICD-10-CM | POA: Diagnosis not present

## 2020-03-14 DIAGNOSIS — Z23 Encounter for immunization: Secondary | ICD-10-CM | POA: Diagnosis not present

## 2020-03-14 DIAGNOSIS — Z13220 Encounter for screening for lipoid disorders: Secondary | ICD-10-CM | POA: Diagnosis not present

## 2020-03-14 DIAGNOSIS — Z0000 Encounter for general adult medical examination without abnormal findings: Secondary | ICD-10-CM | POA: Diagnosis not present

## 2020-04-03 ENCOUNTER — Other Ambulatory Visit: Payer: Self-pay | Admitting: Family Medicine

## 2020-04-03 DIAGNOSIS — Z1231 Encounter for screening mammogram for malignant neoplasm of breast: Secondary | ICD-10-CM

## 2020-04-11 DIAGNOSIS — B9689 Other specified bacterial agents as the cause of diseases classified elsewhere: Secondary | ICD-10-CM | POA: Diagnosis not present

## 2020-04-11 DIAGNOSIS — Z113 Encounter for screening for infections with a predominantly sexual mode of transmission: Secondary | ICD-10-CM | POA: Diagnosis not present

## 2020-04-11 DIAGNOSIS — N760 Acute vaginitis: Secondary | ICD-10-CM | POA: Diagnosis not present

## 2020-04-11 DIAGNOSIS — N898 Other specified noninflammatory disorders of vagina: Secondary | ICD-10-CM | POA: Diagnosis not present

## 2020-05-03 ENCOUNTER — Other Ambulatory Visit: Payer: Self-pay

## 2020-05-03 ENCOUNTER — Ambulatory Visit
Admission: RE | Admit: 2020-05-03 | Discharge: 2020-05-03 | Disposition: A | Discharge status: Home / Self Care | Payer: BC Managed Care – PPO | Source: Ambulatory Visit | Attending: Family Medicine | Admitting: Family Medicine

## 2020-05-03 DIAGNOSIS — Z1231 Encounter for screening mammogram for malignant neoplasm of breast: Secondary | ICD-10-CM

## 2020-05-07 DIAGNOSIS — M7711 Lateral epicondylitis, right elbow: Secondary | ICD-10-CM | POA: Diagnosis not present

## 2020-05-08 DIAGNOSIS — D485 Neoplasm of uncertain behavior of skin: Secondary | ICD-10-CM | POA: Diagnosis not present

## 2020-06-17 DIAGNOSIS — H5213 Myopia, bilateral: Secondary | ICD-10-CM | POA: Diagnosis not present

## 2020-06-21 DIAGNOSIS — Z23 Encounter for immunization: Secondary | ICD-10-CM | POA: Diagnosis not present

## 2020-08-10 HISTORY — PX: REDUCTION MAMMAPLASTY: SUR839

## 2020-08-20 DIAGNOSIS — Z1159 Encounter for screening for other viral diseases: Secondary | ICD-10-CM | POA: Diagnosis not present

## 2021-06-03 ENCOUNTER — Other Ambulatory Visit: Payer: Self-pay | Admitting: Family Medicine

## 2021-06-03 DIAGNOSIS — Z1231 Encounter for screening mammogram for malignant neoplasm of breast: Secondary | ICD-10-CM

## 2021-07-07 ENCOUNTER — Ambulatory Visit
Admission: RE | Admit: 2021-07-07 | Discharge: 2021-07-07 | Disposition: A | Discharge status: Home / Self Care | Payer: No Typology Code available for payment source | Source: Ambulatory Visit | Attending: Family Medicine | Admitting: Family Medicine

## 2021-07-07 DIAGNOSIS — Z1231 Encounter for screening mammogram for malignant neoplasm of breast: Secondary | ICD-10-CM

## 2021-07-08 ENCOUNTER — Ambulatory Visit: Payer: BC Managed Care – PPO

## 2021-09-22 IMAGING — MG DIGITAL SCREENING BILAT W/ CAD
4 series · 4 of 4 positions shown · non-contrast
Comparison: Previous exam(s).

CLINICAL DATA: Screening.

EXAM:
DIGITAL SCREENING BILATERAL MAMMOGRAM WITH CAD

[L CC]
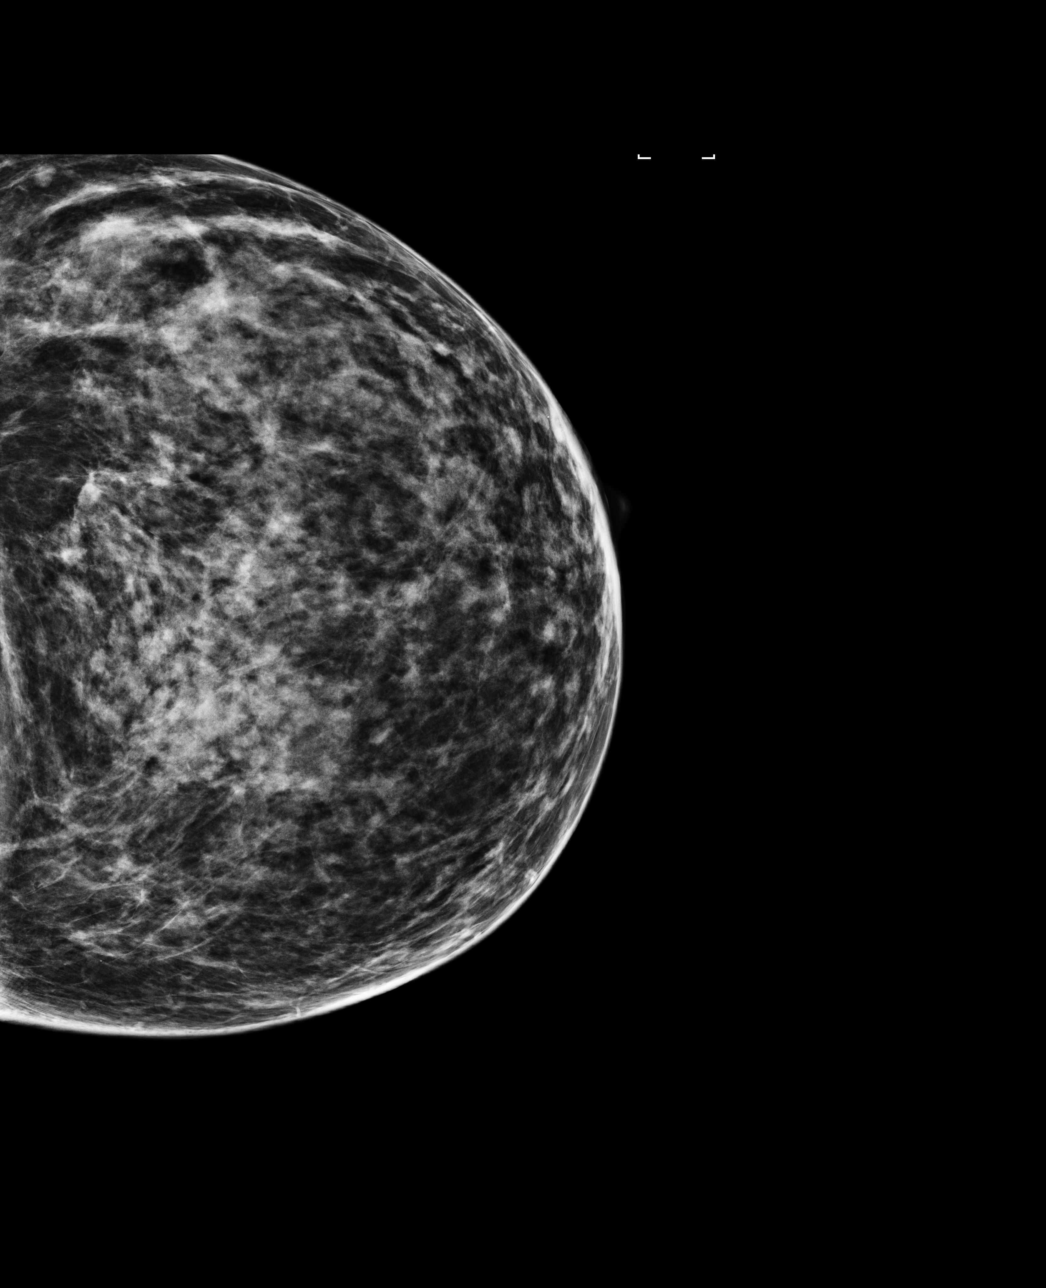

[R CC]
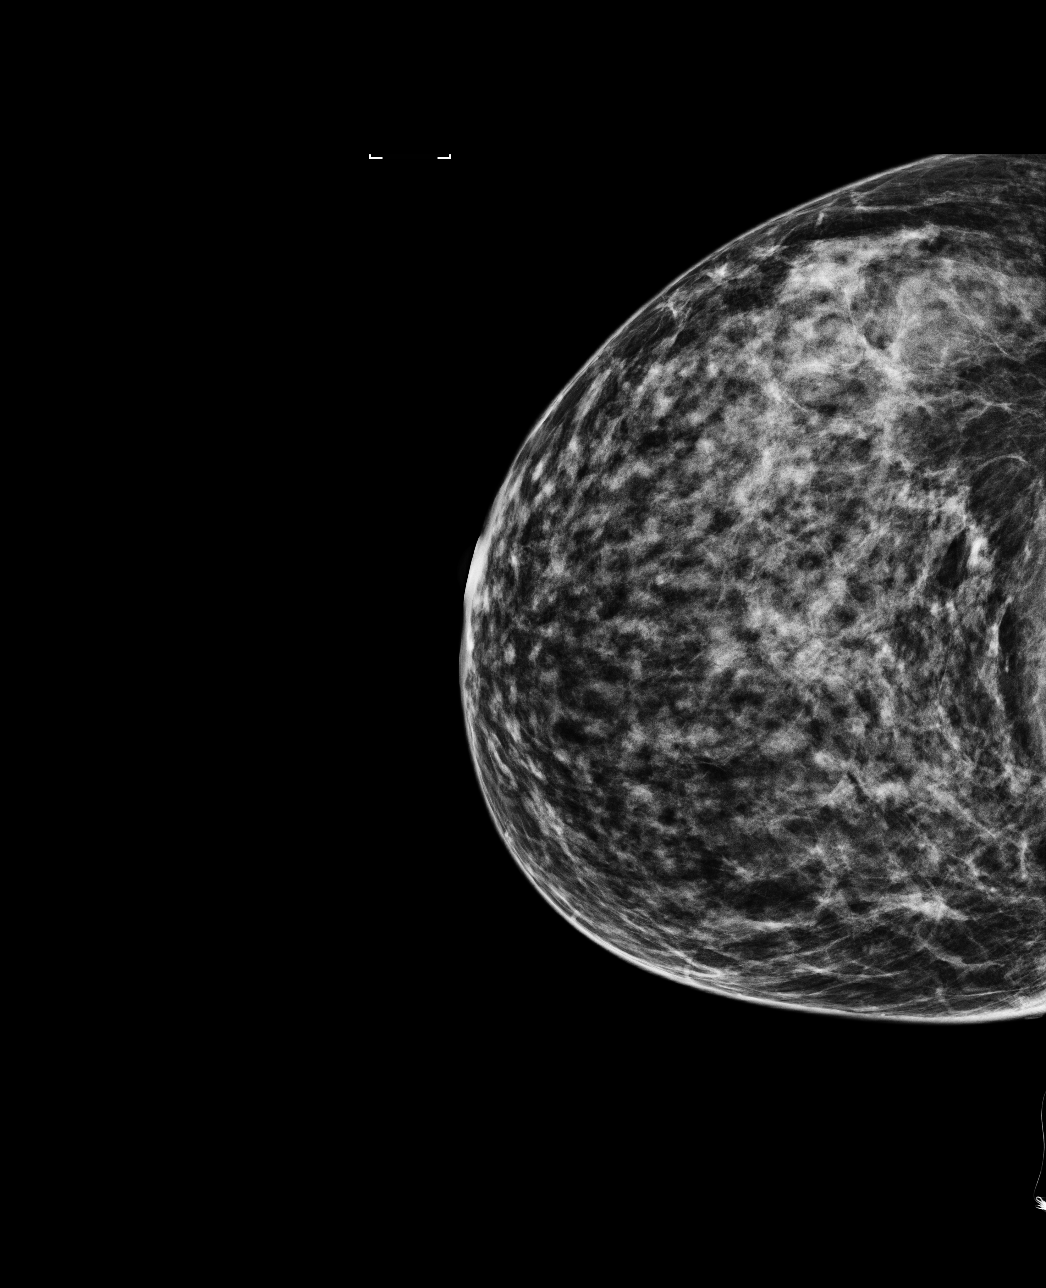

[R MLO]
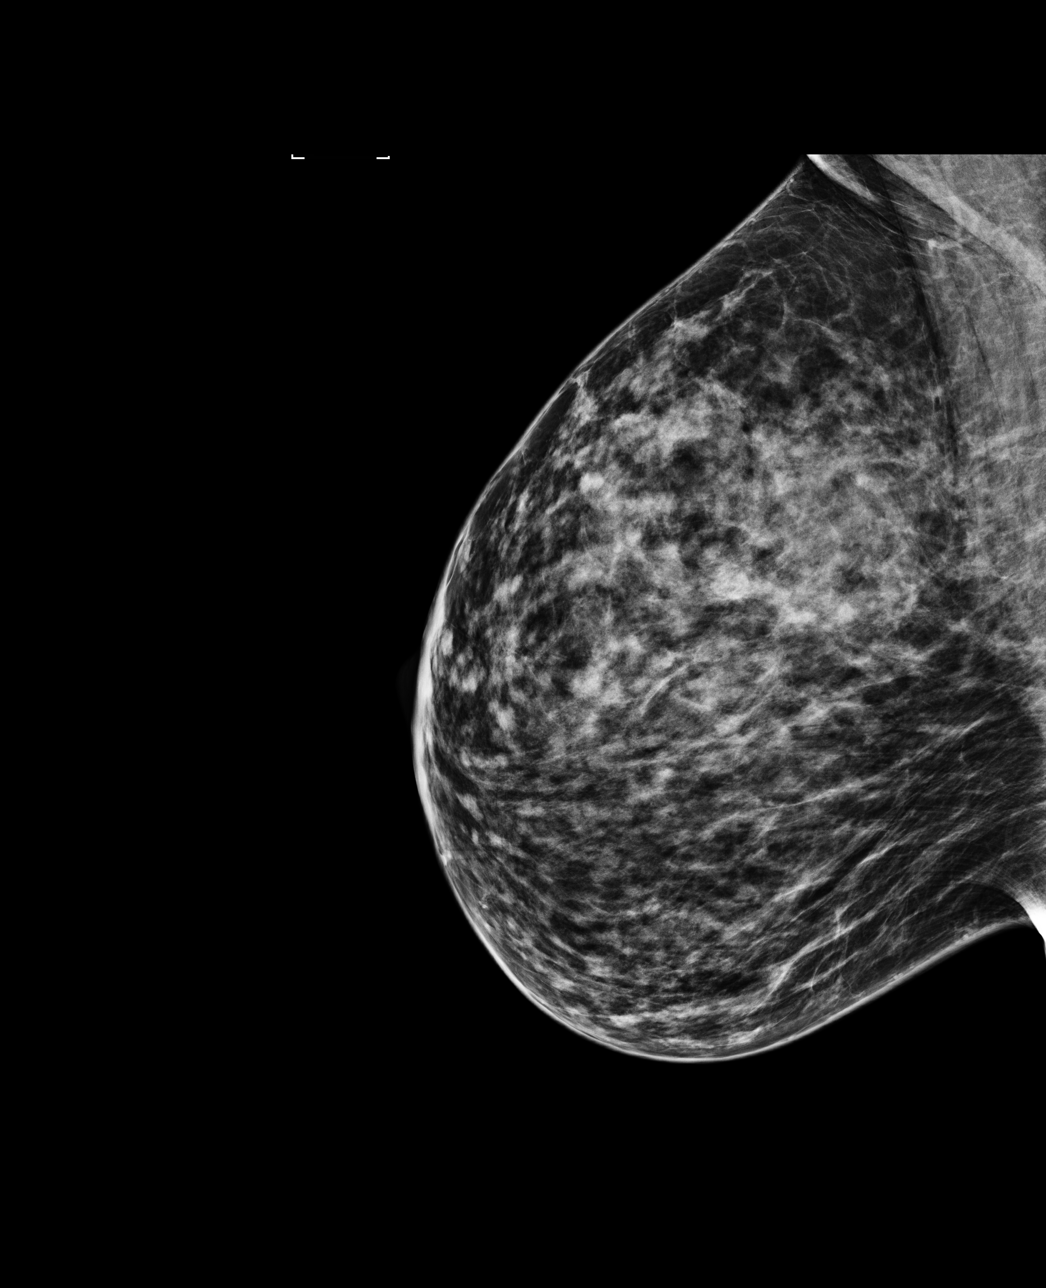

[L MLO]
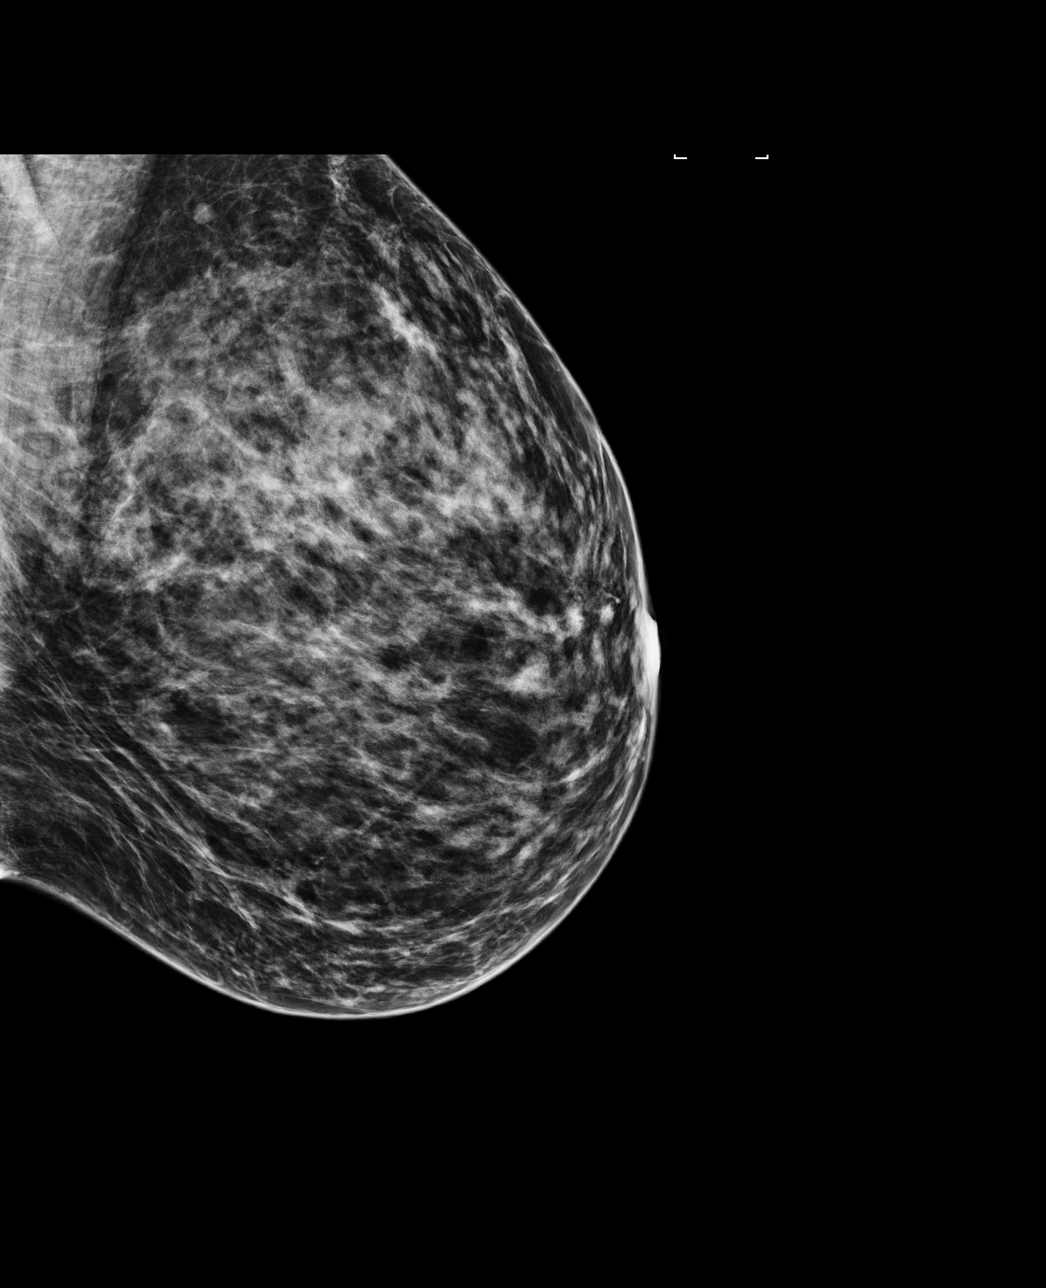

[4 of 4 positions shown; findings below may reference images not displayed]

ACR Breast Density Category c: The breast tissue is heterogeneously
dense, which may obscure small masses.
FINDINGS: There are no findings suspicious for malignancy. Images were
processed with CAD.
IMPRESSION: No mammographic evidence of malignancy. A result letter of this
screening mammogram will be mailed directly to the patient.

RECOMMENDATION:
Screening mammogram in one year. (Code:[id number])

BI-RADS CATEGORY  1: Negative.

## 2022-03-24 DIAGNOSIS — Z13220 Encounter for screening for lipoid disorders: Secondary | ICD-10-CM | POA: Diagnosis not present

## 2022-03-24 DIAGNOSIS — Z0000 Encounter for general adult medical examination without abnormal findings: Secondary | ICD-10-CM | POA: Diagnosis not present

## 2022-03-26 DIAGNOSIS — Z23 Encounter for immunization: Secondary | ICD-10-CM | POA: Diagnosis not present

## 2022-03-26 DIAGNOSIS — Z0000 Encounter for general adult medical examination without abnormal findings: Secondary | ICD-10-CM | POA: Diagnosis not present

## 2022-08-21 ENCOUNTER — Other Ambulatory Visit: Payer: Self-pay | Admitting: Family Medicine

## 2022-08-21 DIAGNOSIS — Z1231 Encounter for screening mammogram for malignant neoplasm of breast: Secondary | ICD-10-CM

## 2022-08-25 ENCOUNTER — Ambulatory Visit: Payer: No Typology Code available for payment source

## 2022-09-03 ENCOUNTER — Ambulatory Visit
Admission: RE | Admit: 2022-09-03 | Discharge: 2022-09-03 | Disposition: A | Discharge status: Home / Self Care | Payer: Managed Care, Other (non HMO) | Source: Ambulatory Visit | Attending: Family Medicine | Admitting: Family Medicine

## 2022-09-03 DIAGNOSIS — Z1231 Encounter for screening mammogram for malignant neoplasm of breast: Secondary | ICD-10-CM

## 2022-09-08 ENCOUNTER — Other Ambulatory Visit: Payer: Self-pay | Admitting: Family Medicine

## 2022-09-08 DIAGNOSIS — R928 Other abnormal and inconclusive findings on diagnostic imaging of breast: Secondary | ICD-10-CM

## 2022-09-10 ENCOUNTER — Other Ambulatory Visit: Payer: Self-pay | Admitting: Family Medicine

## 2022-09-10 ENCOUNTER — Ambulatory Visit
Admission: RE | Admit: 2022-09-10 | Discharge: 2022-09-10 | Disposition: A | Discharge status: Home / Self Care | Payer: Managed Care, Other (non HMO) | Source: Ambulatory Visit | Attending: Family Medicine | Admitting: Family Medicine

## 2022-09-10 DIAGNOSIS — R928 Other abnormal and inconclusive findings on diagnostic imaging of breast: Secondary | ICD-10-CM

## 2022-09-18 ENCOUNTER — Ambulatory Visit
Admission: RE | Admit: 2022-09-18 | Discharge: 2022-09-18 | Disposition: A | Discharge status: Home / Self Care | Payer: Managed Care, Other (non HMO) | Source: Ambulatory Visit | Attending: Family Medicine | Admitting: Family Medicine

## 2022-09-18 DIAGNOSIS — R928 Other abnormal and inconclusive findings on diagnostic imaging of breast: Secondary | ICD-10-CM

## 2022-09-18 HISTORY — PX: BREAST BIOPSY: SHX20

## 2023-04-26 DIAGNOSIS — Z13220 Encounter for screening for lipoid disorders: Secondary | ICD-10-CM | POA: Diagnosis not present

## 2023-04-26 DIAGNOSIS — Z0000 Encounter for general adult medical examination without abnormal findings: Secondary | ICD-10-CM | POA: Diagnosis not present

## 2023-05-03 DIAGNOSIS — E559 Vitamin D deficiency, unspecified: Secondary | ICD-10-CM | POA: Diagnosis not present

## 2023-05-03 DIAGNOSIS — G4700 Insomnia, unspecified: Secondary | ICD-10-CM | POA: Diagnosis not present

## 2023-05-03 DIAGNOSIS — F40240 Claustrophobia: Secondary | ICD-10-CM | POA: Diagnosis not present

## 2023-05-03 DIAGNOSIS — Z0000 Encounter for general adult medical examination without abnormal findings: Secondary | ICD-10-CM | POA: Diagnosis not present

## 2023-05-03 DIAGNOSIS — E876 Hypokalemia: Secondary | ICD-10-CM | POA: Diagnosis not present

## 2023-05-03 DIAGNOSIS — Z23 Encounter for immunization: Secondary | ICD-10-CM | POA: Diagnosis not present

## 2023-10-11 ENCOUNTER — Other Ambulatory Visit: Payer: Self-pay | Admitting: Family Medicine

## 2023-10-11 DIAGNOSIS — Z1231 Encounter for screening mammogram for malignant neoplasm of breast: Secondary | ICD-10-CM

## 2023-10-13 ENCOUNTER — Ambulatory Visit
Admission: RE | Admit: 2023-10-13 | Discharge: 2023-10-13 | Disposition: A | Discharge status: Home / Self Care | Source: Ambulatory Visit | Attending: Family Medicine | Admitting: Family Medicine

## 2023-10-13 DIAGNOSIS — Z1231 Encounter for screening mammogram for malignant neoplasm of breast: Secondary | ICD-10-CM

## 2023-10-27 DIAGNOSIS — Z6826 Body mass index (BMI) 26.0-26.9, adult: Secondary | ICD-10-CM | POA: Diagnosis not present

## 2023-10-27 DIAGNOSIS — F430 Acute stress reaction: Secondary | ICD-10-CM | POA: Diagnosis not present

## 2023-10-27 DIAGNOSIS — G4700 Insomnia, unspecified: Secondary | ICD-10-CM | POA: Diagnosis not present

## 2023-11-24 DIAGNOSIS — R030 Elevated blood-pressure reading, without diagnosis of hypertension: Secondary | ICD-10-CM | POA: Diagnosis not present

## 2023-11-24 DIAGNOSIS — Z6826 Body mass index (BMI) 26.0-26.9, adult: Secondary | ICD-10-CM | POA: Diagnosis not present

## 2023-11-24 DIAGNOSIS — Z09 Encounter for follow-up examination after completed treatment for conditions other than malignant neoplasm: Secondary | ICD-10-CM | POA: Diagnosis not present
